# Patient Record
Sex: Female | Born: 1970 | Race: White | Hispanic: No | Marital: Married | Smoking: Never smoker
Health system: Southern US, Community
[De-identification: ages and names within clinical notes are randomized; demographics above are authoritative.]

## PROBLEM LIST (undated history)

## (undated) DIAGNOSIS — M25552 Pain in left hip: Secondary | ICD-10-CM

## (undated) DIAGNOSIS — R87629 Unspecified abnormal cytological findings in specimens from vagina: Secondary | ICD-10-CM

## (undated) DIAGNOSIS — Z309 Encounter for contraceptive management, unspecified: Secondary | ICD-10-CM

## (undated) DIAGNOSIS — M25551 Pain in right hip: Secondary | ICD-10-CM

## (undated) DIAGNOSIS — G43829 Menstrual migraine, not intractable, without status migrainosus: Secondary | ICD-10-CM

## (undated) HISTORY — DX: Menstrual migraine, not intractable, without status migrainosus: G43.829

## (undated) HISTORY — DX: Pain in left hip: M25.552

## (undated) HISTORY — DX: Unspecified abnormal cytological findings in specimens from vagina: R87.629

## (undated) HISTORY — PX: CERVICAL CONE BIOPSY: SUR198

## (undated) HISTORY — DX: Encounter for contraceptive management, unspecified: Z30.9

## (undated) HISTORY — DX: Pain in right hip: M25.551

---

## 2000-08-22 ENCOUNTER — Other Ambulatory Visit: Admission: RE | Admit: 2000-08-22 | Discharge: 2000-08-22 | Payer: Self-pay | Admitting: Obstetrics and Gynecology

## 2002-04-12 ENCOUNTER — Inpatient Hospital Stay (HOSPITAL_COMMUNITY): Admission: AD | Admit: 2002-04-12 | Discharge: 2002-04-14 | Payer: Self-pay | Admitting: Obstetrics and Gynecology

## 2007-02-27 ENCOUNTER — Other Ambulatory Visit: Admission: RE | Admit: 2007-02-27 | Discharge: 2007-02-27 | Payer: Self-pay | Admitting: Obstetrics and Gynecology

## 2008-02-28 ENCOUNTER — Other Ambulatory Visit: Admission: RE | Admit: 2008-02-28 | Discharge: 2008-02-28 | Payer: Self-pay | Admitting: Obstetrics and Gynecology

## 2009-06-20 ENCOUNTER — Other Ambulatory Visit: Admission: RE | Admit: 2009-06-20 | Discharge: 2009-06-20 | Payer: Self-pay | Admitting: Obstetrics and Gynecology

## 2010-05-28 ENCOUNTER — Other Ambulatory Visit: Payer: Self-pay | Admitting: Obstetrics & Gynecology

## 2010-05-28 DIAGNOSIS — R928 Other abnormal and inconclusive findings on diagnostic imaging of breast: Secondary | ICD-10-CM

## 2010-06-10 ENCOUNTER — Ambulatory Visit (HOSPITAL_COMMUNITY)
Admission: RE | Admit: 2010-06-10 | Discharge: 2010-06-10 | Disposition: A | Discharge status: Home / Self Care | Payer: BC Managed Care – PPO | Source: Ambulatory Visit | Attending: Obstetrics & Gynecology | Admitting: Obstetrics & Gynecology

## 2010-06-10 DIAGNOSIS — R928 Other abnormal and inconclusive findings on diagnostic imaging of breast: Secondary | ICD-10-CM | POA: Insufficient documentation

## 2010-06-12 NOTE — H&P (Signed)
   [name], [name]                         ACCOUNT NO.:  [id number]   MEDICAL RECORD NO.:  [id number]                   PATIENT TYPE:  INP   LOCATION:  LDR2                                 FACILITY:  [facility]   PHYSICIAN:  Tilda Burrow, M.D.              DATE OF BIRTH:  Jul 26, 1970   DATE OF ADMISSION:  04/12/2002  DATE OF DISCHARGE:                                HISTORY & PHYSICAL   CHIEF COMPLAINT:  Spontaneous rupture of membranes with minimal labor.   HISTORY OF PRESENT ILLNESS:  The patient is a 40 year old gravida 2, para 1  with an EDC of April 20, 2002 based on last menstrual period and correlating  first and second trimester ultrasounds.  She began prenatal care early in  the first trimester and has had regular visits.   PRENATAL LABORATORIES:  Blood type O+.  Rubella is immune.  HBSAG, HIV,  gonorrhea, Chlamydia, and GBS are all negative.  One-hour GTT was normal at  110.  Blood pressures have been 110s-120s/60s-80s.  Total weight gain is 34  pounds with a normal fundal height growth.   PAST MEDICAL HISTORY:  Noncontributory.  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   PAST SURGICAL HISTORY:  Laser cone on the cervix in the early 90s.  No  history of blood transfusions.   PAST OBSTETRICAL HISTORY:  Had a vacuum assisted delivery of a 7 pound 6  ounce female in 2001 at [redacted] weeks gestation after a 15-hour labor.   FAMILY HISTORY:  History of diabetes, brain, stomach, liver, and breast  cancer.   SOCIAL HISTORY:  Denies smoking, drinking, or using illegal drugs.  Is  married and has support from the father of the baby.   PHYSICAL EXAMINATION:  HEENT:  Within normal limits.  HEART:  Regular rate and rhythm.  LUNGS:  Clear to auscultation bilaterally.  ABDOMEN:  Soft and nontender.  Having mild irregular contractions.  Leaking  clear amniotic fluid.  PELVIC:  Cervix is 4, 80, -1 per R.N. examination.  Fetal heart rate is  reactive with an  occasional mild variable deceleration.  EXTREMITIES:  Legs are negative.   IMPRESSION:  1. Intrauterine pregnancy at 38 weeks 6 days.  2. Spontaneous rupture of membranes.  3. Apparently beginning labor.  Cervix on March 16 was 2-3 cm, thick, and     anterior.    PLAN:  The patient desires epidural at some point in her labor.  Will allow  labor to progress spontaneously at this point.     Jacklyn Shell, C.N.M.          Tilda Burrow, M.D.    FC/MEDQ  D:  04/12/2002  T:  04/12/2002  Job:  [id number]   cc:   [facility]

## 2010-06-12 NOTE — Op Note (Signed)
   NAME:  [name], [name]                         ACCOUNT NO.:  [id number]   MEDICAL RECORD NO.:  [id number]                   PATIENT TYPE:  INP   LOCATION:  LDR2                                 FACILITY:  [facility]   PHYSICIAN:  Tilda Burrow, M.D.              DATE OF BIRTH:  1970/08/03   DATE OF PROCEDURE:  04/12/2002  DATE OF DISCHARGE:                                 OPERATIVE REPORT   DELIVERY NOTE:  The patient was noted to be fully dilated with complaints of  rectal pressure at approximately 1645.  She was allowed to push and began  pushing at approximately 1700.  The fetal heart rate began having variable  decelerations with each push down to the 50s with an occasional slow return  to baseline.  However, good variability remained in the baseline and between  contractions.  O2 was utilized and the baby was making progress.  At 1748  the patient had a spontaneous vaginal delivery of a viable female infant.  Apgars are 8 and 9, weight 8 pounds 10 ounces.  Twenty units of Pitocin  dilated in 1000 mL of lactated Ringer's was then infused rapidly IV.  The  placenta separated spontaneously and was delivered via maternal pushing  effort and controlled cord traction at 1753.  It was inspected and appears  to be intact with a three-vessel cord.  The fundus was immediately firm.  The vagina was then inspected and a second degree laceration was noted.  It  was infiltrated with 10 mL of 1% Xylocaine and repaired with a 2-0 Vicryl  suture.  The patient tolerated the procedure well; however, she complained  of intense contraction pain after the delivery of the placenta, so she was  given 2 mg of morphine IV, to which she responded well.     Jacklyn Shell, C.N.M.          Tilda Burrow, M.D.    FC/MEDQ  D:  04/12/2002  T:  04/13/2002  Job:  [id number]

## 2010-06-12 NOTE — Discharge Summary (Signed)
   [name], [name]                         ACCOUNT NO.:  [id number]   MEDICAL RECORD NO.:  [id number]                   PATIENT TYPE:  INP   LOCATION:  A426                                 FACILITY:  [facility]   PHYSICIAN:  Tilda Burrow, M.D.              DATE OF BIRTH:  [birth year]   DATE OF ADMISSION:  04/12/2002  DATE OF DISCHARGE:  04/14/2002                                 DISCHARGE SUMMARY   ADMISSION DIAGNOSES:  1. Pregnancy [redacted] week gestation.  2. Spontaneous rupture of membranes without labor.   DISCHARGE DIAGNOSIS:  Pregnancy 39 weeks, delivered.   PROCEDURE:  1. On 04/12/2002 Pitocin augmentation of labor, indicated.  2. Lumbar epidural catheter placement by Lazaro Arms, M.D.  3. Spontaneous vertex vaginal delivery, median episiotomy and repair by     Jacklyn Shell, C.N.M.   DISCHARGE MEDICATIONS:  1. Multivitamins with iron p.o. daily.  2. Barrier contraception until 6 months postpartum then permanent     sterilization planned.   HISTORY OF PRESENT ILLNESS:  This 40 year old female, gravida 2, para 1 with  EDC 04/20/2002 was admitted after pregnancy care followed throughout office  with blood type O positive, rubella immunity present.  Hepatitis, HIV, GC,  Chlamydia and GBS all negative.  One hour glucose tolerance test normal at  110 mg% with normal blood pressures and 34 pound weight gain.   PAST MEDICAL HISTORY:  Negative with a history of mild postpartum  depression.   PAST SURGICAL HISTORY:  Laser conization in 1990.  No transfusion.  OB  history 15 hour labor with the first baby.   PHYSICAL EXAMINATION:  Physical exam showed a term sized fetus, vertex  presentation with cervix 4 cm, 80% minus 1 upon admission.   HOSPITAL COURSE:  The patient was admitted with Pitocin augmentation of  labor initiated.  She progressed in an 8-hour labor to vaginal delivery over  a second-degree median episiotomy repaired under local anesthesia with 2-0  Vicryl.   The infant was a healthy female infant, Apgars 8 and 9 weight 8 pounds 10  ounces.  Circumcision was performed the day of discharge.  Follow up will be  in 4 weeks with contraception plans uncertain, but likely barrier  contraception for 6 months and then consideration of permanent of  sterilization.                                               Tilda Burrow, M.D.    JVF/MEDQ  D:  04/14/2002  T:  04/15/2002  Job:  [id number]   cc:   [facility]  [address]  [address] [place]  [place]  [address]

## 2010-06-12 NOTE — Op Note (Signed)
   NAME:  ANAJA, [name]                         ACCOUNT NO.:  [id number]   MEDICAL RECORD NO.:  [id number]                   PATIENT TYPE:  INP   LOCATION:  LDR2                                 FACILITY:  [facility]   PHYSICIAN:  Lazaro Arms, M.D.                DATE OF BIRTH:  1970-09-13   DATE OF PROCEDURE:  04/12/2002  DATE OF DISCHARGE:                                 OPERATIVE REPORT   EPIDURAL NOTE:   INDICATIONS FOR PROCEDURE:  [name] is a 40 year old gravida 2, para 1, in  labor with ruptured membranes, 4 cm requesting an epidural.  She is having  pretty regular labor mild-to-moderate.   DESCRIPTION OF PROCEDURE:  The patient is placed in sitting position.  She  received 1000 mL fluid bolus. There is reactive fetal heart tracing.  The  iliac crests were identified and the spinous processes are identified at the  L2-L3 interspaces  This is marked, prepped, and field draped.  Lidocaine 1%  is injected as a local anesthetic.  A 17-gauge Touhy needle is placed in the  L2-L3 interspace and a loss of resistance technique employed.  With 1 pass  the epidural space is found using this technique.  Then 10 mL of 0.125%  bupivacaine was given as a test dose.  The epidural catheter is then  threaded, without difficulty, to a depth of 5 cm into the epidural space or  11 cm at the skin.  An additional 10 mL was given.  There was no adverse  reaction.  The epidural catheter is sterilely taped in place and the  continuous pump is hooked up.  She is given a 7 mL bolus of 0.125%  bupivacaine with 2 mcg/mL of Fentanyl; and then 12 mL an hour.  The patient  is comfortable and there seems to be reactive fetal heart rate tracing and  the blood pressure is currently stable.                                               Lazaro Arms, M.D.    Loraine Maple  D:  04/12/2002  T:  04/12/2002  Job:  [id number]

## 2010-09-10 ENCOUNTER — Other Ambulatory Visit (HOSPITAL_COMMUNITY)
Admission: RE | Admit: 2010-09-10 | Discharge: 2010-09-10 | Disposition: A | Discharge status: Home / Self Care | Payer: BC Managed Care – PPO | Source: Ambulatory Visit | Attending: Obstetrics and Gynecology | Admitting: Obstetrics and Gynecology

## 2010-09-10 DIAGNOSIS — Z01419 Encounter for gynecological examination (general) (routine) without abnormal findings: Secondary | ICD-10-CM | POA: Insufficient documentation

## 2011-06-28 ENCOUNTER — Other Ambulatory Visit: Payer: Self-pay | Admitting: Adult Health

## 2011-06-28 DIAGNOSIS — Z139 Encounter for screening, unspecified: Secondary | ICD-10-CM

## 2011-07-06 ENCOUNTER — Ambulatory Visit (HOSPITAL_COMMUNITY)
Admission: RE | Admit: 2011-07-06 | Discharge: 2011-07-06 | Disposition: A | Discharge status: Home / Self Care | Payer: BC Managed Care – PPO | Source: Ambulatory Visit | Attending: Adult Health | Admitting: Adult Health

## 2011-07-06 DIAGNOSIS — Z1231 Encounter for screening mammogram for malignant neoplasm of breast: Secondary | ICD-10-CM | POA: Insufficient documentation

## 2011-07-06 DIAGNOSIS — Z139 Encounter for screening, unspecified: Secondary | ICD-10-CM

## 2011-11-01 ENCOUNTER — Other Ambulatory Visit (HOSPITAL_COMMUNITY)
Admission: RE | Admit: 2011-11-01 | Discharge: 2011-11-01 | Disposition: A | Discharge status: Home / Self Care | Payer: BC Managed Care – PPO | Source: Ambulatory Visit | Attending: Obstetrics and Gynecology | Admitting: Obstetrics and Gynecology

## 2011-11-01 DIAGNOSIS — Z1151 Encounter for screening for human papillomavirus (HPV): Secondary | ICD-10-CM | POA: Insufficient documentation

## 2011-11-01 DIAGNOSIS — Z01419 Encounter for gynecological examination (general) (routine) without abnormal findings: Secondary | ICD-10-CM | POA: Insufficient documentation

## 2012-09-29 ENCOUNTER — Other Ambulatory Visit: Payer: Self-pay | Admitting: Adult Health

## 2012-10-03 ENCOUNTER — Other Ambulatory Visit: Payer: Self-pay | Admitting: *Deleted

## 2012-10-03 MED ORDER — NORETHIN ACE-ETH ESTRAD-FE 1-20 MG-MCG PO TABS: 1.0000 | ORAL_TABLET | Freq: Every day | ORAL | Status: DC

## 2013-08-15 ENCOUNTER — Other Ambulatory Visit: Payer: Self-pay | Admitting: Adult Health

## 2014-04-04 ENCOUNTER — Other Ambulatory Visit: Payer: Self-pay | Admitting: Adult Health

## 2014-05-03 ENCOUNTER — Other Ambulatory Visit: Payer: Self-pay | Admitting: Adult Health

## 2014-09-02 ENCOUNTER — Other Ambulatory Visit: Payer: Self-pay | Admitting: Adult Health

## 2015-02-04 ENCOUNTER — Other Ambulatory Visit: Payer: Self-pay | Admitting: Adult Health

## 2015-02-23 ENCOUNTER — Other Ambulatory Visit: Payer: Self-pay | Admitting: Adult Health

## 2015-04-10 ENCOUNTER — Other Ambulatory Visit: Payer: Self-pay | Admitting: Adult Health

## 2015-04-10 DIAGNOSIS — Z1231 Encounter for screening mammogram for malignant neoplasm of breast: Secondary | ICD-10-CM

## 2015-04-12 ENCOUNTER — Other Ambulatory Visit: Payer: Self-pay | Admitting: Adult Health

## 2015-04-24 ENCOUNTER — Ambulatory Visit (HOSPITAL_COMMUNITY): Payer: Self-pay

## 2015-05-07 ENCOUNTER — Other Ambulatory Visit (HOSPITAL_COMMUNITY)
Admission: RE | Admit: 2015-05-07 | Discharge: 2015-05-07 | Disposition: A | Discharge status: Home / Self Care | Payer: 59 | Source: Ambulatory Visit | Attending: Adult Health | Admitting: Adult Health

## 2015-05-07 ENCOUNTER — Encounter: Payer: Self-pay | Admitting: Adult Health

## 2015-05-07 ENCOUNTER — Ambulatory Visit (INDEPENDENT_AMBULATORY_CARE_PROVIDER_SITE_OTHER): Payer: 59 | Admitting: Adult Health

## 2015-05-07 ENCOUNTER — Ambulatory Visit (HOSPITAL_COMMUNITY)
Admission: RE | Admit: 2015-05-07 | Discharge: 2015-05-07 | Disposition: A | Discharge status: Home / Self Care | Payer: 59 | Source: Ambulatory Visit | Attending: Adult Health | Admitting: Adult Health

## 2015-05-07 VITALS — BP 110/70 | HR 64 | Ht 60.0 in | Wt 135.5 lb

## 2015-05-07 DIAGNOSIS — Z01419 Encounter for gynecological examination (general) (routine) without abnormal findings: Secondary | ICD-10-CM | POA: Insufficient documentation

## 2015-05-07 DIAGNOSIS — G43829 Menstrual migraine, not intractable, without status migrainosus: Secondary | ICD-10-CM | POA: Insufficient documentation

## 2015-05-07 DIAGNOSIS — Z1212 Encounter for screening for malignant neoplasm of rectum: Secondary | ICD-10-CM

## 2015-05-07 DIAGNOSIS — M25552 Pain in left hip: Secondary | ICD-10-CM

## 2015-05-07 DIAGNOSIS — Z3041 Encounter for surveillance of contraceptive pills: Secondary | ICD-10-CM

## 2015-05-07 DIAGNOSIS — Z1151 Encounter for screening for human papillomavirus (HPV): Secondary | ICD-10-CM | POA: Insufficient documentation

## 2015-05-07 DIAGNOSIS — Z1231 Encounter for screening mammogram for malignant neoplasm of breast: Secondary | ICD-10-CM | POA: Diagnosis not present

## 2015-05-07 DIAGNOSIS — M25551 Pain in right hip: Secondary | ICD-10-CM

## 2015-05-07 DIAGNOSIS — Z309 Encounter for contraceptive management, unspecified: Secondary | ICD-10-CM

## 2015-05-07 HISTORY — DX: Encounter for contraceptive management, unspecified: Z30.9

## 2015-05-07 HISTORY — DX: Pain in right hip: M25.551

## 2015-05-07 HISTORY — DX: Menstrual migraine, not intractable, without status migrainosus: G43.829

## 2015-05-07 LAB — HEMOCCULT GUIAC POC 1CARD (OFFICE): Fecal Occult Blood, POC: NEGATIVE

## 2015-05-07 MED ORDER — NORETHIN ACE-ETH ESTRAD-FE 1-20 MG-MCG PO TABS: 1.0000 | ORAL_TABLET | Freq: Every day | ORAL | Status: DC

## 2015-05-07 NOTE — Progress Notes (Signed)
Patient ID: Marcella Dubsngela P Carel, female   DOB: Jul 30, 1970, 45 y.o.   MRN: [id number] History of Present Illness: Marylene Landngela is a 45 year old white female, married in for a well woman gyn exam and pap.She is happy with her OCs and does not have a period.She has menstrual headaches at times and they last 2-3 days.She also is having bilateral hip pain but L>R and it is on and off and has been since Dillion was born 13 years ago, and knees hurt at times, she sits at work.She says she had mammogram this am and has teeth cleaned and got invisalign braces.  PCP is [facility]  Current Medications, Allergies, Past Medical History, Past Surgical History, Family History and Social History were reviewed in [facility] electronic medical record.     Review of Systems: Patient denies any daily headaches, hearing loss, fatigue, blurred vision, shortness of breath, chest pain, abdominal pain, problems with bowel movements, urination, or intercourse. No  mood swings. See HPI for positives.   Physical Exam:BP 110/70 mmHg  Pulse 64  Ht 5' (1.524 m)  Wt 135 lb 8 oz (61.462 kg)  BMI 26.46 kg/m2 General:  Well developed, well nourished, no acute distress Skin:  Warm and dry Neck:  Midline trachea, normal thyroid, good ROM, no lymphadenopathy Lungs; Clear to auscultation bilaterally Breast:  No dominant palpable mass, retraction, or nipple discharge Cardiovascular: Regular rate and rhythm Abdomen:  Soft, non tender, no hepatosplenomegaly Pelvic:  External genitalia is normal in appearance, no lesions.  The vagina is normal in appearance. Urethra has no lesions or masses. The cervix is bulbous.Pap with HPV performed.  Uterus is felt to be normal size, shape, and contour.  No adnexal masses or tenderness noted.Bladder is non tender, no masses felt. Rectal: Good sphincter tone, no polyps, or hemorrhoids felt.  Hemoccult negative. Extremities/musculoskeletal:  No swelling or varicosities noted, no clubbing or cyanosis, no point  tenderness at hips.She says they are fine today. Psych:  No mood changes, alert and cooperative,seems happy   Impression: Well woman gyn exam and pap Bilateral hip pain Contraceptive management Menstrual headaches     Plan: Check CBC,CMP,TSH and lipids,A1c and vitamin D Refilled junel 1-20 x 1 year Mammogram yearly Physical in 1 year, pap in 3 if normal Colonoscopy at 49 Call orthopedic for hips

## 2015-05-07 NOTE — Patient Instructions (Addendum)
Physical in 1 year Mammogram yearly Pap in 3 years Will talk when labs back  [place]o [place] or Armen PickupMurphy Wanier in [place] for hip

## 2015-05-08 ENCOUNTER — Telehealth: Payer: Self-pay | Admitting: Adult Health

## 2015-05-08 LAB — VITAMIN D 25 HYDROXY (VIT D DEFICIENCY, FRACTURES): Vit D, 25-Hydroxy: 29.9 ng/mL — ABNORMAL LOW (ref 30.0–100.0)

## 2015-05-08 LAB — LIPID PANEL
CHOL/HDL RATIO: 3.9 ratio (ref 0.0–4.4)
Cholesterol, Total: 201 mg/dL — ABNORMAL HIGH (ref 100–199)
HDL: 52 mg/dL (ref >39)
LDL Calculated: 125 mg/dL — ABNORMAL HIGH (ref 0–99)
Triglycerides: 119 mg/dL (ref 0–149)
VLDL CHOLESTEROL CAL: 24 mg/dL (ref 5–40)

## 2015-05-08 LAB — HEMOGLOBIN A1C
Est. average glucose Bld gHb Est-mCnc: 108 mg/dL
Hgb A1c MFr Bld: 5.4 % (ref 4.8–5.6)

## 2015-05-08 LAB — COMPREHENSIVE METABOLIC PANEL
ALBUMIN: 4.6 g/dL (ref 3.5–5.5)
ALT: 15 IU/L (ref 0–32)
AST: 18 IU/L (ref 0–40)
Albumin/Globulin Ratio: 1.8 (ref 1.2–2.2)
Alkaline Phosphatase: 45 IU/L (ref 39–117)
BUN / CREAT RATIO: 15 (ref 9–23)
BUN: 11 mg/dL (ref 6–24)
Bilirubin Total: 0.5 mg/dL (ref 0.0–1.2)
CALCIUM: 9.5 mg/dL (ref 8.7–10.2)
CO2: 24 mmol/L (ref 18–29)
CREATININE: 0.75 mg/dL (ref 0.57–1.00)
Chloride: 98 mmol/L (ref 96–106)
GFR calc non Af Amer: 96 mL/min/{1.73_m2} (ref >59)
GFR, EST AFRICAN AMERICAN: 111 mL/min/{1.73_m2} (ref >59)
GLUCOSE: 74 mg/dL (ref 65–99)
Globulin, Total: 2.6 g/dL (ref 1.5–4.5)
Potassium: 4.5 mmol/L (ref 3.5–5.2)
Sodium: 139 mmol/L (ref 134–144)
TOTAL PROTEIN: 7.2 g/dL (ref 6.0–8.5)

## 2015-05-08 LAB — CBC
HEMOGLOBIN: 14.5 g/dL (ref 11.1–15.9)
Hematocrit: 42.7 % (ref 34.0–46.6)
MCH: 30.2 pg (ref 26.6–33.0)
MCHC: 34 g/dL (ref 31.5–35.7)
MCV: 89 fL (ref 79–97)
Platelets: 314 10*3/uL (ref 150–379)
RBC: 4.8 x10E6/uL (ref 3.77–5.28)
RDW: 13.3 % (ref 12.3–15.4)
WBC: 6.1 10*3/uL (ref 3.4–10.8)

## 2015-05-08 LAB — TSH: TSH: 2.65 u[IU]/mL (ref 0.450–4.500)

## 2015-05-08 NOTE — Telephone Encounter (Signed)
Pt aware of labs to take vitamin D 3 1000-2000 IU per day

## 2015-05-09 LAB — CYTOLOGY - PAP

## 2016-01-31 ENCOUNTER — Other Ambulatory Visit: Payer: Self-pay | Admitting: Adult Health

## 2016-03-01 ENCOUNTER — Other Ambulatory Visit: Payer: Self-pay | Admitting: *Deleted

## 2016-03-01 MED ORDER — NORETHIN ACE-ETH ESTRAD-FE 1-20 MG-MCG PO TABS: 1.0000 | ORAL_TABLET | Freq: Every day | ORAL | 3 refills | Status: DC

## 2016-12-14 ENCOUNTER — Other Ambulatory Visit: Payer: Self-pay | Admitting: Adult Health

## 2017-02-25 ENCOUNTER — Other Ambulatory Visit: Payer: Self-pay | Admitting: Adult Health

## 2017-02-25 DIAGNOSIS — Z1231 Encounter for screening mammogram for malignant neoplasm of breast: Secondary | ICD-10-CM

## 2017-03-29 ENCOUNTER — Other Ambulatory Visit: Payer: 59 | Admitting: Adult Health

## 2017-03-31 ENCOUNTER — Ambulatory Visit (INDEPENDENT_AMBULATORY_CARE_PROVIDER_SITE_OTHER): Payer: 59 | Admitting: Adult Health

## 2017-03-31 ENCOUNTER — Encounter: Payer: Self-pay | Admitting: Advanced Practice Midwife

## 2017-03-31 ENCOUNTER — Ambulatory Visit (HOSPITAL_COMMUNITY)
Admission: RE | Admit: 2017-03-31 | Discharge: 2017-03-31 | Disposition: A | Discharge status: Home / Self Care | Payer: 59 | Source: Ambulatory Visit | Attending: Adult Health | Admitting: Adult Health

## 2017-03-31 ENCOUNTER — Encounter: Payer: Self-pay | Admitting: Adult Health

## 2017-03-31 VITALS — BP 130/90 | HR 81 | Ht 59.5 in | Wt 146.5 lb

## 2017-03-31 DIAGNOSIS — Z3041 Encounter for surveillance of contraceptive pills: Secondary | ICD-10-CM

## 2017-03-31 DIAGNOSIS — R030 Elevated blood-pressure reading, without diagnosis of hypertension: Secondary | ICD-10-CM | POA: Diagnosis not present

## 2017-03-31 DIAGNOSIS — Z1231 Encounter for screening mammogram for malignant neoplasm of breast: Secondary | ICD-10-CM | POA: Insufficient documentation

## 2017-03-31 DIAGNOSIS — Z1211 Encounter for screening for malignant neoplasm of colon: Secondary | ICD-10-CM

## 2017-03-31 DIAGNOSIS — Z1212 Encounter for screening for malignant neoplasm of rectum: Secondary | ICD-10-CM | POA: Diagnosis not present

## 2017-03-31 DIAGNOSIS — Z01411 Encounter for gynecological examination (general) (routine) with abnormal findings: Secondary | ICD-10-CM

## 2017-03-31 DIAGNOSIS — Z01419 Encounter for gynecological examination (general) (routine) without abnormal findings: Secondary | ICD-10-CM | POA: Insufficient documentation

## 2017-03-31 LAB — HEMOCCULT GUIAC POC 1CARD (OFFICE): FECAL OCCULT BLD: NEGATIVE

## 2017-03-31 MED ORDER — NORETHIN ACE-ETH ESTRAD-FE 1-20 MG-MCG PO TABS: 1.0000 | ORAL_TABLET | Freq: Every day | ORAL | 3 refills | Status: DC

## 2017-03-31 NOTE — Progress Notes (Signed)
Patient ID: Marcella Dubsngela P Dresser, female   DOB: 1970-02-05, 47 y.o.   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  She had mammogram this morning.   Current Medications, Allergies, Past Medical History, Past Surgical History, Family History and Social History were reviewed in [facility] Link electronic medical record.     Review of Systems: Patient denies any headaches, hearing loss, fatigue, blurred vision, shortness of breath, chest pain, abdominal pain, problems with bowel movements, urination, or intercourse(not comfortable at times). No joint pain or mood swings.    Physical Exam:BP 130/90 (BP Location: Left Arm, Cuff Size: Normal)   Pulse 81   Ht 4' 11.5" (1.511 m)   Wt 146 lb 8 oz (66.5 kg)   BMI 29.09 kg/m  General:  Well developed, well nourished, no acute distress Skin:  Warm and dry Neck:  Midline trachea, normal thyroid, good ROM, no lymphadenopathy Lungs; Clear to auscultation bilaterally Breast:  No dominant palpable mass, retraction, or nipple discharge Cardiovascular: Regular rate and rhythm Abdomen:  Soft, non tender, no hepatosplenomegaly Pelvic:  External genitalia is normal in appearance, no lesions.  The vagina is normal in appearance. Urethra has no lesions or masses. The cervix is bulbous.  Uterus is felt to be normal size, shape, and contour.  No adnexal masses or tenderness noted.Bladder is non tender, no masses felt. Rectal: Good sphincter tone, no polyps, or hemorrhoids felt.  Hemoccult negative. Extremities/musculoskeletal:  No swelling or varicosities noted, no clubbing or cyanosis Psych:  No mood changes, alert and cooperative,seems happy PHQ 2 score 0.  Impression: 1. Well woman exam with routine gynecological exam   2. Encounter for  surveillance of contraceptive pills   3. Screening for colorectal cancer   4. Elevated BP without diagnosis of hypertension       Plan: Use good lubricate, and increase foreplay  Meds ordered this encounter  Medications  . norethindrone-ethinyl estradiol (JUNEL FE 1/20) 1-20 MG-MCG tablet    Sig: Take 1 tablet by mouth daily.    Dispense:  84 tablet    Refill:  3    Order Specific Question:   Supervising Provider    Answer:   Despina HiddenEURE, LUTHER H [[id number]]  Recheck BP at work  Pap and physical in 1 year Mammogram yearly Fasting labs next year

## 2018-04-14 ENCOUNTER — Other Ambulatory Visit: Payer: Self-pay | Admitting: Adult Health

## 2018-06-13 ENCOUNTER — Other Ambulatory Visit (HOSPITAL_COMMUNITY): Payer: Self-pay | Admitting: Adult Health

## 2018-06-13 DIAGNOSIS — Z1231 Encounter for screening mammogram for malignant neoplasm of breast: Secondary | ICD-10-CM

## 2018-08-23 ENCOUNTER — Telehealth: Payer: Self-pay | Admitting: Adult Health

## 2018-08-23 MED ORDER — SULFAMETHOXAZOLE-TRIMETHOPRIM 800-160 MG PO TABS: 1.0000 | ORAL_TABLET | Freq: Two times a day (BID) | ORAL | 0 refills | Status: DC

## 2018-08-23 NOTE — Addendum Note (Signed)
Addended by: Derrek Monaco A on: 08/23/2018 01:59 PM   Modules accepted: Orders

## 2018-08-23 NOTE — Telephone Encounter (Signed)
Pt is wanting to see if medication for a UTI can be sent in for her.

## 2018-08-23 NOTE — Telephone Encounter (Signed)
Will rx septra ds  

## 2018-08-23 NOTE — Telephone Encounter (Signed)
Pt having burning with urination and pressure in pelvic region. + discomfort. Can you send in med? Thanks!! [place]

## 2018-10-12 ENCOUNTER — Other Ambulatory Visit: Payer: Self-pay | Admitting: Women's Health

## 2018-10-12 MED ORDER — LO LOESTRIN FE 1 MG-10 MCG / 10 MCG PO TABS: 1.0000 | ORAL_TABLET | Freq: Every day | ORAL | 3 refills | Status: DC

## 2018-11-07 DIAGNOSIS — Z23 Encounter for immunization: Secondary | ICD-10-CM | POA: Diagnosis not present

## 2019-03-21 DIAGNOSIS — Z23 Encounter for immunization: Secondary | ICD-10-CM | POA: Diagnosis not present

## 2019-10-04 DIAGNOSIS — H10413 Chronic giant papillary conjunctivitis, bilateral: Secondary | ICD-10-CM | POA: Diagnosis not present

## 2019-10-26 ENCOUNTER — Other Ambulatory Visit: Payer: Self-pay | Admitting: Women's Health

## 2019-10-31 ENCOUNTER — Telehealth: Payer: Self-pay | Admitting: Adult Health

## 2019-10-31 ENCOUNTER — Other Ambulatory Visit (HOSPITAL_COMMUNITY): Payer: Self-pay | Admitting: Adult Health

## 2019-10-31 DIAGNOSIS — Z1231 Encounter for screening mammogram for malignant neoplasm of breast: Secondary | ICD-10-CM

## 2019-10-31 DIAGNOSIS — R7989 Other specified abnormal findings of blood chemistry: Secondary | ICD-10-CM

## 2019-10-31 DIAGNOSIS — Z01419 Encounter for gynecological examination (general) (routine) without abnormal findings: Secondary | ICD-10-CM

## 2019-10-31 DIAGNOSIS — Z131 Encounter for screening for diabetes mellitus: Secondary | ICD-10-CM

## 2019-10-31 MED ORDER — NORETHIN ACE-ETH ESTRAD-FE 1-20 MG-MCG PO TABS: ORAL_TABLET | ORAL | 3 refills | Status: DC

## 2019-10-31 NOTE — Telephone Encounter (Signed)
Pt aware that pills sent in and that lab orders are in

## 2019-10-31 NOTE — Telephone Encounter (Signed)
Check CBC,CMP,TSH and lipids,A1c and vitamin D,orders in.

## 2019-10-31 NOTE — Addendum Note (Signed)
Addended by: Cyril Mourning A on: 10/31/2019 12:31 PM   Modules accepted: Orders

## 2019-10-31 NOTE — Telephone Encounter (Signed)
Patient called stating that she was given a refill of her [facility] by another provider here but was not prescribed like Victorino Dike states to take them, Pt states that she would like a call back from [place] because she has run out of her [facility] and she can not get a refill until 11/10/2019. Please contact pt

## 2019-10-31 NOTE — Telephone Encounter (Signed)
Telephoned patient at home number and unable to leave message due to mailbox not set up.  °

## 2019-10-31 NOTE — Addendum Note (Signed)
Addended by: Cyril Mourning A on: 10/31/2019 12:27 PM   Modules accepted: Orders

## 2019-10-31 NOTE — Telephone Encounter (Signed)
[name] can you put in her fasting physical labs she has appt with you on 11-16 ang going to [facility] in [place] on NOV 8 th to get fasting labs

## 2019-10-31 NOTE — Telephone Encounter (Signed)
Telephoned patient at home number. Patient states prescription for birth control was written incorrectly. Patient states she takes them continuously. If not taken like this will experience severe migraines. Would like another prescription sent in stating this. Patient did schedule physical. Advised would send to provider to see what could be done. Patient voiced understanding.

## 2019-11-07 ENCOUNTER — Other Ambulatory Visit: Payer: Self-pay

## 2019-11-07 ENCOUNTER — Ambulatory Visit (HOSPITAL_COMMUNITY)
Admission: RE | Admit: 2019-11-07 | Discharge: 2019-11-07 | Disposition: A | Discharge status: Home / Self Care | Payer: BLUE CROSS/BLUE SHIELD | Source: Ambulatory Visit | Attending: Adult Health | Admitting: Adult Health

## 2019-11-07 DIAGNOSIS — Z1231 Encounter for screening mammogram for malignant neoplasm of breast: Secondary | ICD-10-CM | POA: Diagnosis not present

## 2019-11-12 ENCOUNTER — Other Ambulatory Visit (HOSPITAL_COMMUNITY): Payer: Self-pay | Admitting: Adult Health

## 2019-11-12 DIAGNOSIS — R928 Other abnormal and inconclusive findings on diagnostic imaging of breast: Secondary | ICD-10-CM

## 2019-11-15 DIAGNOSIS — Z23 Encounter for immunization: Secondary | ICD-10-CM | POA: Diagnosis not present

## 2019-11-22 DIAGNOSIS — H10413 Chronic giant papillary conjunctivitis, bilateral: Secondary | ICD-10-CM | POA: Diagnosis not present

## 2019-12-03 ENCOUNTER — Other Ambulatory Visit: Payer: 59

## 2019-12-03 DIAGNOSIS — Z01419 Encounter for gynecological examination (general) (routine) without abnormal findings: Secondary | ICD-10-CM | POA: Diagnosis not present

## 2019-12-03 DIAGNOSIS — Z131 Encounter for screening for diabetes mellitus: Secondary | ICD-10-CM | POA: Diagnosis not present

## 2019-12-03 DIAGNOSIS — R7989 Other specified abnormal findings of blood chemistry: Secondary | ICD-10-CM | POA: Diagnosis not present

## 2019-12-04 LAB — COMPREHENSIVE METABOLIC PANEL
ALT: 15 IU/L (ref 0–32)
AST: 20 IU/L (ref 0–40)
Albumin/Globulin Ratio: 2 (ref 1.2–2.2)
Albumin: 4.5 g/dL (ref 3.8–4.8)
Alkaline Phosphatase: 49 IU/L (ref 44–121)
BUN/Creatinine Ratio: 12 (ref 9–23)
BUN: 12 mg/dL (ref 6–24)
Bilirubin Total: 0.3 mg/dL (ref 0.0–1.2)
CO2: 22 mmol/L (ref 20–29)
Calcium: 9.6 mg/dL (ref 8.7–10.2)
Chloride: 103 mmol/L (ref 96–106)
Creatinine, Ser: 0.98 mg/dL (ref 0.57–1.00)
GFR calc Af Amer: 78 mL/min/{1.73_m2} (ref >59)
GFR calc non Af Amer: 67 mL/min/{1.73_m2} (ref >59)
Globulin, Total: 2.3 g/dL (ref 1.5–4.5)
Glucose: 86 mg/dL (ref 65–99)
Potassium: 4 mmol/L (ref 3.5–5.2)
Sodium: 139 mmol/L (ref 134–144)
Total Protein: 6.8 g/dL (ref 6.0–8.5)

## 2019-12-04 LAB — TSH: TSH: 2.7 u[IU]/mL (ref 0.450–4.500)

## 2019-12-04 LAB — LIPID PANEL
Chol/HDL Ratio: 4.5 ratio — ABNORMAL HIGH (ref 0.0–4.4)
Cholesterol, Total: 202 mg/dL — ABNORMAL HIGH (ref 100–199)
HDL: 45 mg/dL (ref >39)
LDL Chol Calc (NIH): 138 mg/dL — ABNORMAL HIGH (ref 0–99)
Triglycerides: 105 mg/dL (ref 0–149)
VLDL Cholesterol Cal: 19 mg/dL (ref 5–40)

## 2019-12-04 LAB — HEMOGLOBIN A1C
Est. average glucose Bld gHb Est-mCnc: 103 mg/dL
Hgb A1c MFr Bld: 5.2 % (ref 4.8–5.6)

## 2019-12-04 LAB — CBC
Hematocrit: 43.7 % (ref 34.0–46.6)
Hemoglobin: 14.6 g/dL (ref 11.1–15.9)
MCH: 29.9 pg (ref 26.6–33.0)
MCHC: 33.4 g/dL (ref 31.5–35.7)
MCV: 90 fL (ref 79–97)
Platelets: 335 10*3/uL (ref 150–450)
RBC: 4.88 x10E6/uL (ref 3.77–5.28)
RDW: 12 % (ref 11.7–15.4)
WBC: 6.7 10*3/uL (ref 3.4–10.8)

## 2019-12-04 LAB — VITAMIN D 25 HYDROXY (VIT D DEFICIENCY, FRACTURES): Vit D, 25-Hydroxy: 34.2 ng/mL (ref 30.0–100.0)

## 2019-12-11 ENCOUNTER — Ambulatory Visit (INDEPENDENT_AMBULATORY_CARE_PROVIDER_SITE_OTHER): Payer: BLUE CROSS/BLUE SHIELD | Admitting: Adult Health

## 2019-12-11 ENCOUNTER — Ambulatory Visit (HOSPITAL_COMMUNITY)
Admission: RE | Admit: 2019-12-11 | Discharge: 2019-12-11 | Disposition: A | Discharge status: Home / Self Care | Payer: BLUE CROSS/BLUE SHIELD | Source: Ambulatory Visit | Attending: Adult Health | Admitting: Adult Health

## 2019-12-11 ENCOUNTER — Other Ambulatory Visit (HOSPITAL_COMMUNITY)
Admission: RE | Admit: 2019-12-11 | Discharge: 2019-12-11 | Disposition: A | Discharge status: Home / Self Care | Payer: BLUE CROSS/BLUE SHIELD | Source: Ambulatory Visit | Attending: Adult Health | Admitting: Adult Health

## 2019-12-11 ENCOUNTER — Encounter: Payer: Self-pay | Admitting: Adult Health

## 2019-12-11 ENCOUNTER — Other Ambulatory Visit: Payer: Self-pay

## 2019-12-11 ENCOUNTER — Other Ambulatory Visit: Payer: 59 | Admitting: Adult Health

## 2019-12-11 VITALS — BP 126/94 | HR 75 | Ht 59.25 in | Wt 140.5 lb

## 2019-12-11 DIAGNOSIS — Z3041 Encounter for surveillance of contraceptive pills: Secondary | ICD-10-CM | POA: Diagnosis not present

## 2019-12-11 DIAGNOSIS — Z1211 Encounter for screening for malignant neoplasm of colon: Secondary | ICD-10-CM | POA: Diagnosis not present

## 2019-12-11 DIAGNOSIS — R928 Other abnormal and inconclusive findings on diagnostic imaging of breast: Secondary | ICD-10-CM

## 2019-12-11 DIAGNOSIS — Z01419 Encounter for gynecological examination (general) (routine) without abnormal findings: Secondary | ICD-10-CM | POA: Insufficient documentation

## 2019-12-11 DIAGNOSIS — Z1151 Encounter for screening for human papillomavirus (HPV): Secondary | ICD-10-CM | POA: Insufficient documentation

## 2019-12-11 DIAGNOSIS — R922 Inconclusive mammogram: Secondary | ICD-10-CM | POA: Diagnosis not present

## 2019-12-11 LAB — HEMOCCULT GUIAC POC 1CARD (OFFICE): Fecal Occult Blood, POC: NEGATIVE

## 2019-12-11 MED ORDER — NORETHIN ACE-ETH ESTRAD-FE 1-20 MG-MCG PO TABS: ORAL_TABLET | ORAL | 3 refills | Status: DC

## 2019-12-11 NOTE — Progress Notes (Signed)
Patient ID: [name], female   DOB: Mar 21, 1970, 49 y.o.   MRN: [id number] History of Present Illness: [name] is a 49 year old white female,married, G2P2 in for a well woman gyn exam and pap. She is works at [employer], and has 1 more class for her BS. She had abnormal mammogram and has follow up today.   Current Medications, Allergies, Past Medical History, Past Surgical History, Family History and Social History were reviewed in [facility] record.     Review of Systems: Patient denies any headaches, hearing loss, fatigue, blurred vision, shortness of breath, chest pain, abdominal pain, problems with bowel movements, urination, or intercourse. No joint pain or mood swings.    Physical Exam:BP (!) 126/94 (BP Location: Left Arm, Patient Position: Sitting, Cuff Size: Normal)   Pulse 75   Ht 4' 11.25" (1.505 m)   Wt 140 lb 8 oz (63.7 kg)   BMI 28.14 kg/m  General:  Well developed, well nourished, no acute distress Skin:  Warm and dry Neck:  Midline trachea, normal thyroid, good ROM, no lymphadenopathy Lungs; Clear to auscultation bilaterally Breast:  No dominant palpable mass, retraction, or nipple discharge Cardiovascular: Regular rate and rhythm Abdomen:  Soft, non tender, no hepatosplenomegaly Pelvic:  External genitalia is normal in appearance, no lesions.  The vagina is normal in appearance. Urethra has no lesions or masses. The cervix is smooth, pap with high risk HPV genotyping performed.  Uterus is felt to be normal size, shape, and contour.  No adnexal masses or tenderness noted.Bladder is non tender, no masses felt. Rectal: Good sphincter tone, no polyps, or hemorrhoids felt.  Hemoccult negative. Extremities/musculoskeletal:  No swelling or varicosities noted, no clubbing or cyanosis Psych:  No mood changes, alert and cooperative,seems happy Reviewed labs with her, will add omega 3s and try to decrease cheese to increase HDL and lower LDL.  AA is 1 Fall  risk is low PHQ 9 score is 11, no SI Examination chaperoned by Tish,RN.   [facility] - 12/11/19 0957      Pregnancy Intention Screening   Does the patient want to become pregnant in the next year? No    Does the patient's partner want to become pregnant in the next year? No    Would the patient like to discuss contraceptive options today? No      Contraception Wrap Up   Current Method Oral Contraceptive    End Method Oral Contraceptive    Contraception Counseling Provided No           Impression and Plan: 1. Encounter for gynecological examination with Papanicolaou smear of cervix Pap sent Physical in 1 year Pap in 3 if normal Mammogram follow up today Pt declines colonoscopy and also cologuard at this time   2. Encounter for screening fecal occult blood testing   3. Encounter for surveillance of contraceptive pills Will continue OCs til 50, then stop for a month and check FSH  Meds ordered this encounter  Medications  . norethindrone-ethinyl estradiol (JUNEL FE 1/20) 1-20 MG-MCG tablet    Sig: Take 1 daily continuously,skip inert pills    Dispense:  90 tablet    Refill:  3    Order Specific Question:   Supervising Provider    Answer:   Duane Lope H [[id number]]

## 2019-12-14 LAB — CYTOLOGY - PAP
Comment: NEGATIVE
Diagnosis: NEGATIVE
High risk HPV: NEGATIVE

## 2020-01-31 DIAGNOSIS — R6889 Other general symptoms and signs: Secondary | ICD-10-CM | POA: Diagnosis not present

## 2020-11-25 DIAGNOSIS — Z23 Encounter for immunization: Secondary | ICD-10-CM | POA: Diagnosis not present

## 2020-12-24 DIAGNOSIS — J029 Acute pharyngitis, unspecified: Secondary | ICD-10-CM | POA: Diagnosis not present

## 2021-03-02 ENCOUNTER — Other Ambulatory Visit: Payer: Self-pay | Admitting: *Deleted

## 2021-03-02 MED ORDER — NORETHIN ACE-ETH ESTRAD-FE 1-20 MG-MCG PO TABS: ORAL_TABLET | ORAL | 0 refills | Status: DC

## 2021-03-03 ENCOUNTER — Telehealth: Payer: Self-pay

## 2021-03-03 NOTE — Telephone Encounter (Signed)
Called and left message that her prescription had been sent in.

## 2021-03-03 NOTE — Telephone Encounter (Signed)
PT CALLED AND STATED THAT HER PRESCRIPTION HAS EXPIRED AND THE PHARMACY SENT A REQUEST TO GET AN APPROVAL LAST WEEK AND SHE WANTED TO KNOW IF THE REQUEST WAS RECEIVED. PT HAS AN APPOINTMENT FOR MARCH 8 BUT NEEDS MEDS BEFORE THAT DATE

## 2021-04-01 ENCOUNTER — Ambulatory Visit: Payer: BLUE CROSS/BLUE SHIELD | Admitting: Adult Health

## 2021-04-08 ENCOUNTER — Encounter: Payer: Self-pay | Admitting: Adult Health

## 2021-04-08 ENCOUNTER — Ambulatory Visit (INDEPENDENT_AMBULATORY_CARE_PROVIDER_SITE_OTHER): Payer: 59 | Admitting: Adult Health

## 2021-04-08 ENCOUNTER — Other Ambulatory Visit: Payer: Self-pay

## 2021-04-08 VITALS — BP 137/93 | HR 70 | Ht 59.5 in | Wt 138.0 lb

## 2021-04-08 DIAGNOSIS — Z3041 Encounter for surveillance of contraceptive pills: Secondary | ICD-10-CM | POA: Diagnosis not present

## 2021-04-08 DIAGNOSIS — Z01419 Encounter for gynecological examination (general) (routine) without abnormal findings: Secondary | ICD-10-CM | POA: Diagnosis not present

## 2021-04-08 DIAGNOSIS — Z1211 Encounter for screening for malignant neoplasm of colon: Secondary | ICD-10-CM | POA: Diagnosis not present

## 2021-04-08 DIAGNOSIS — F419 Anxiety disorder, unspecified: Secondary | ICD-10-CM | POA: Insufficient documentation

## 2021-04-08 DIAGNOSIS — R030 Elevated blood-pressure reading, without diagnosis of hypertension: Secondary | ICD-10-CM

## 2021-04-08 LAB — HEMOCCULT GUIAC POC 1CARD (OFFICE): Fecal Occult Blood, POC: NEGATIVE

## 2021-04-08 MED ORDER — NORETHIN ACE-ETH ESTRAD-FE 1-20 MG-MCG PO TABS: ORAL_TABLET | ORAL | 3 refills | Status: DC

## 2021-04-08 MED ORDER — HYDROXYZINE HCL 10 MG PO TABS: 10.0000 mg | ORAL_TABLET | Freq: Three times a day (TID) | ORAL | 1 refills | Status: DC | PRN

## 2021-04-08 NOTE — Progress Notes (Signed)
Patient ID: [name], female   DOB: September 22, 1970, 51 y.o.   MRN: [id number] ?History of Present Illness: ?[name] is a 51 year old white female,married, G2P2 in for a well woman gyn exam. She has moved to [place] to be closer to kids, both in college. ?She was off OCs a week and spotted, but good now that back on. ?She currently has no PCP, but looking. ?Lab Results  ?Component Value Date  ? DIAGPAP  12/11/2019  ?  - Negative for intraepithelial lesion or malignancy (NILM)  ? HPVHIGH Negative 12/11/2019  ?  ? ? ?Current Medications, Allergies, Past Medical History, Past Surgical History, Family History and Social History were reviewed in [employer] record.   ? ? ?Review of Systems: ?Patient denies any headaches, hearing loss, fatigue, blurred vision, shortness of breath, chest pain, abdominal pain, problems with bowel movements, urination, or intercourse. No joint pain or mood swings.  ?+anxiety ? ? ?Physical Exam:BP (!) 137/93 (BP Location: Left Arm, Patient Position: Sitting, Cuff Size: Normal)   Pulse 70   Ht 4' 11.5" (1.511 m)   Wt 138 lb (62.6 kg)   BMI 27.41 kg/m?   ?General:  Well developed, well nourished, no acute distress ?Skin:  Warm and dry ?Neck:  Midline trachea, normal thyroid, good ROM, no lymphadenopathy ?Lungs; Clear to auscultation bilaterally ?Breast:  No dominant palpable mass, retraction, or nipple discharge ?Cardiovascular: Regular rate and rhythm ?Abdomen:  Soft, non tender, no hepatosplenomegaly ?Pelvic:  External genitalia is normal in appearance, no lesions.  The vagina is normal in appearance. Urethra has no lesions or masses. The cervix is bulbous.  Uterus is felt to be normal size, shape, and contour.  No adnexal masses or tenderness noted.Bladder is non tender, no masses felt. ?Rectal: Good sphincter tone, no polyps, or hemorrhoids felt.  Hemoccult negative. ?Extremities/musculoskeletal:  No swelling or varicosities noted, no clubbing or cyanosis ?Psych:   No mood changes, alert and cooperative,seems happy ?AA is 2 ?Fall risk is low ?Depression screen [facility] 2/9 04/08/2021 12/11/2019 03/31/2017  ?Decreased Interest 1 1 0  ?Down, Depressed, Hopeless 1 1 0  ?PHQ - 2 Score 2 2 0  ?Altered sleeping 1 1 -  ?Tired, decreased energy 1 1 -  ?Change in appetite 1 1 -  ?Feeling bad or failure about yourself  1 2 -  ?Trouble concentrating 1 2 -  ?Moving slowly or fidgety/restless 0 2 -  ?Suicidal thoughts 0 0 -  ?PHQ-9 Score 7 11 -  ?Difficult doing work/chores - Somewhat difficult -  ?  ?GAD 7 : Generalized Anxiety Score 04/08/2021 12/11/2019  ?Nervous, Anxious, on Edge 2 2  ?Control/stop worrying 2 2  ?Worry too much - different things 2 2  ?Trouble relaxing 2 2  ?Restless 2 2  ?Easily annoyed or irritable 2 2  ?Afraid - awful might happen 2 0  ?Total GAD 7 Score 14 12  ? ?  ? Upstream - 04/08/21 1148   ? ?  ? Pregnancy Intention Screening  ? Does the patient want to become pregnant in the next year? No   ? Does the patient's partner want to become pregnant in the next year? No   ? Would the patient like to discuss contraceptive options today? No   ?  ? Contraception Wrap Up  ? Current Method Oral Contraceptive   ? End Method Oral Contraceptive   ? ?  ?  ? ?  ?  ? 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 ? ?Impression and Plan: ?1. Encounter for surveillance of contraceptive pills ?Will refill junel 1/20 ?Meds ordered this encounter  ?Medications  ? hydrOXYzine (ATARAX) 10 MG tablet  ?  Sig: Take 1 tablet (10 mg total) by mouth 3 (three) times daily as needed.  ?  Dispense:  60 tablet  ?  Refill:  1  ?  Order Specific Question:   Supervising Provider  ?  Answer:   Duane Lope H [[id number]]  ? norethindrone-ethinyl estradiol-FE (JUNEL FE 1/20) 1-20 MG-MCG tablet  ?  Sig: Take 1 daily continuously,skip inert pills  ?  Dispense:  90 tablet  ?  Refill:  3  ?  Order Specific Question:   Supervising Provider  ?  Answer:   Duane Lope H [[id number]]  ?  ? ?2. Encounter for screening fecal occult  blood testing ? ?3. Well woman exam with routine gynecological exam ?Pap and physical in 1 year ? ?4. Anxiety ?Will rx vistaril 10 mg tid prn, as she does not want to take something daily  ?She feels it is related to daughter being out of the country doing missionary work, and lives in apartment currently ? ?5. Elevated BP without diagnosis of hypertension ?Keep check on BP at Home ?Review DASH diet, decrease salt and sugar and walk more ?Take omega 3 more regularly  ? ? ? ?  ?  ?

## 2021-10-19 IMAGING — MG DIGITAL SCREENING BILAT W/ TOMO W/ CAD
8 series · 9 of 24 positions shown · non-contrast
Comparison: Previous exam(s).

CLINICAL DATA: Screening.

EXAM:
DIGITAL SCREENING BILATERAL MAMMOGRAM WITH TOMO AND CAD

[R CC synth-2D]
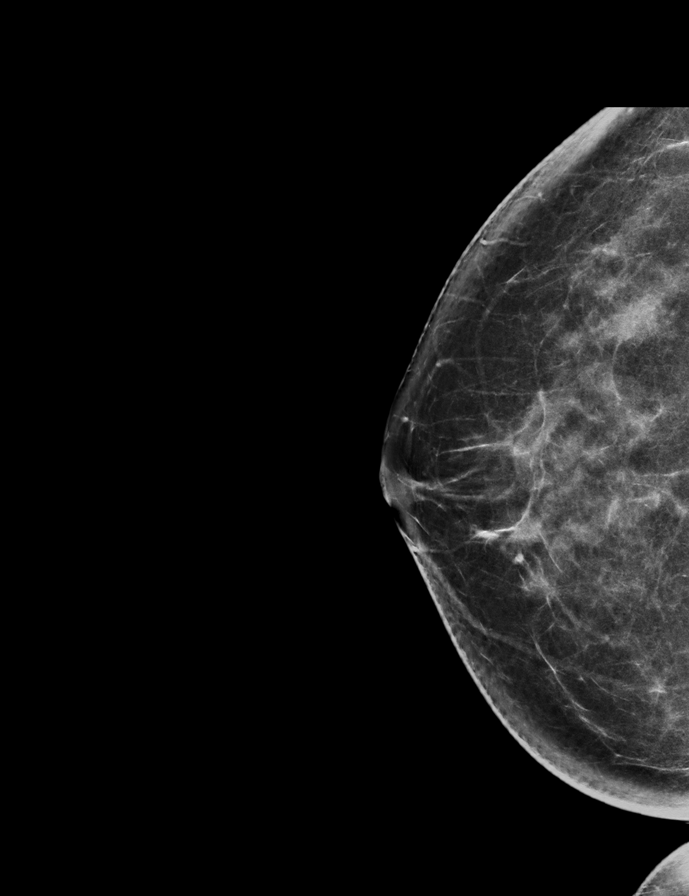

[R MLO synth-2D]
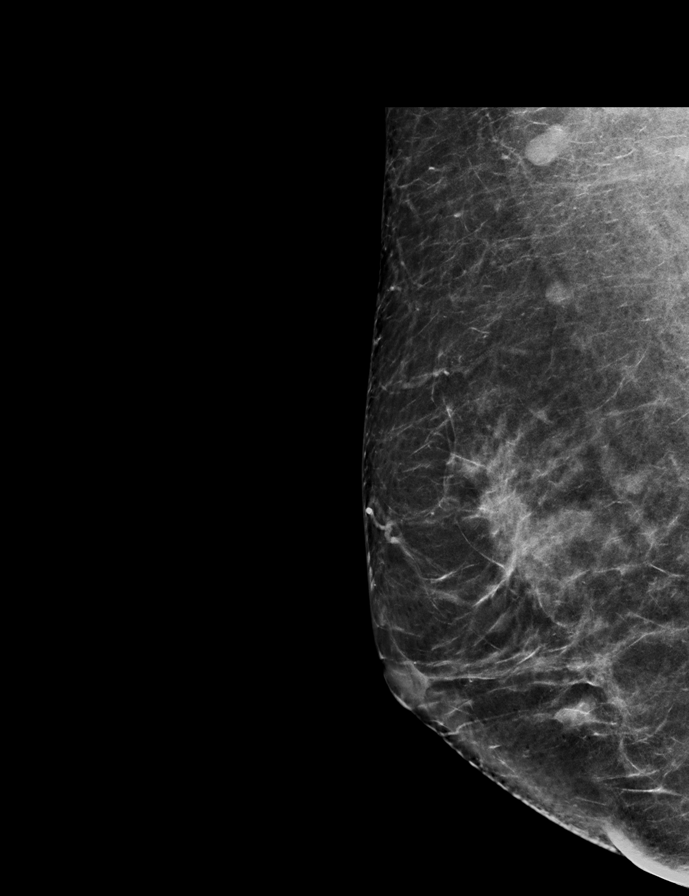

[L CC synth-2D]
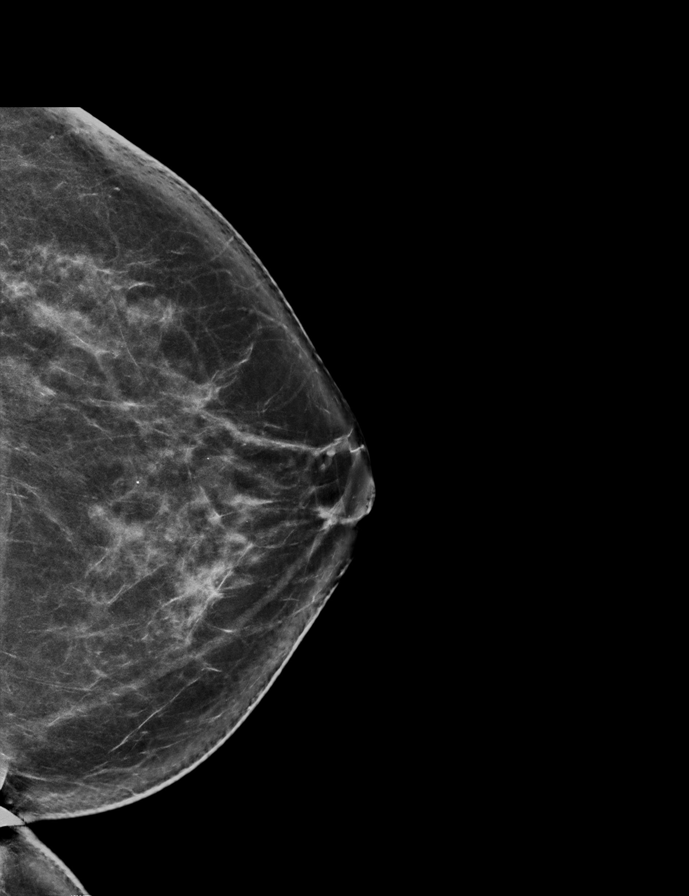

[L MLO synth-2D]
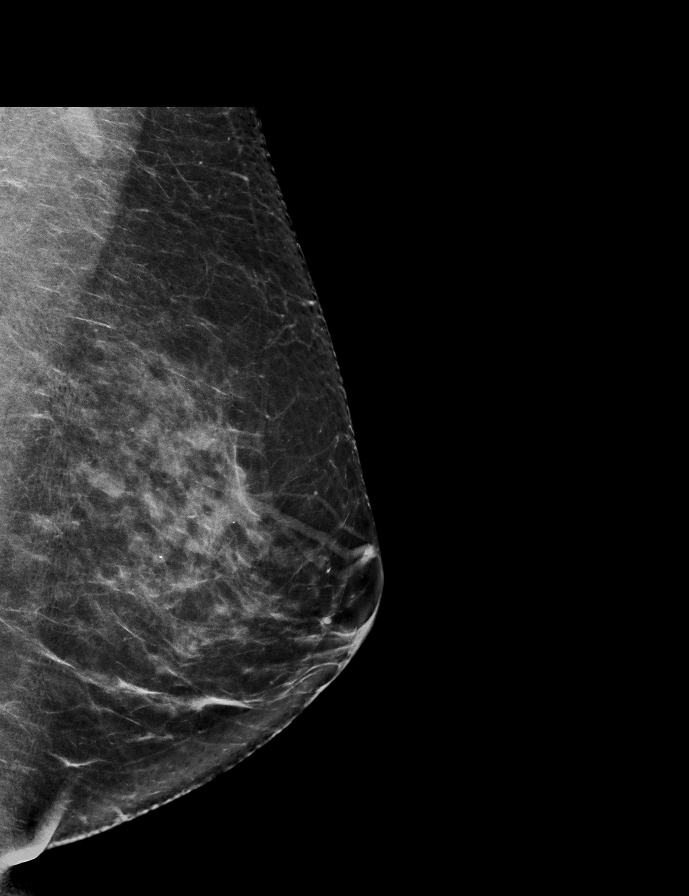

[L CC tomo · 2 of 76 frames shown]
[frame 25/76]
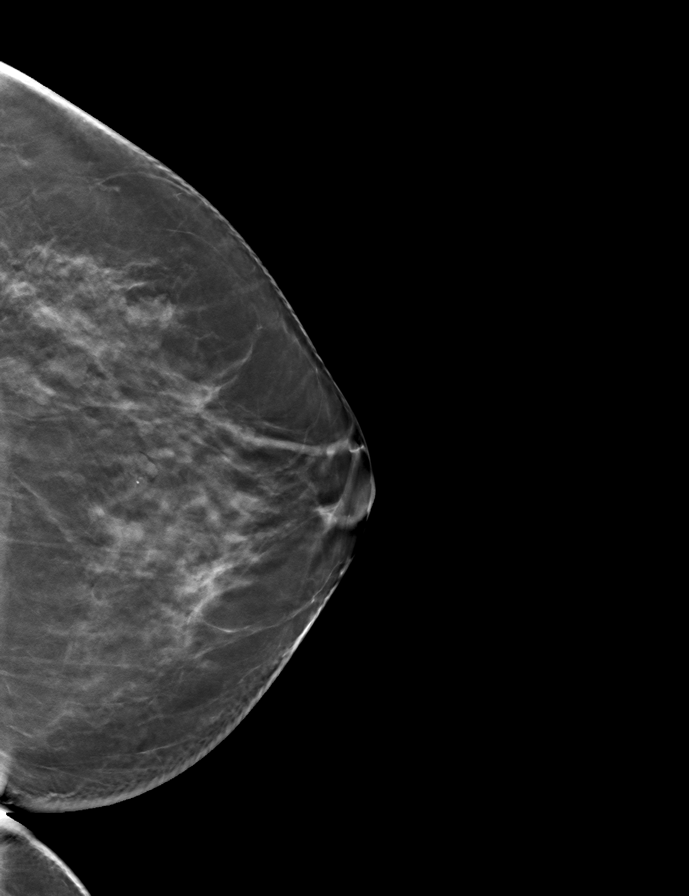
[frame 39/76]
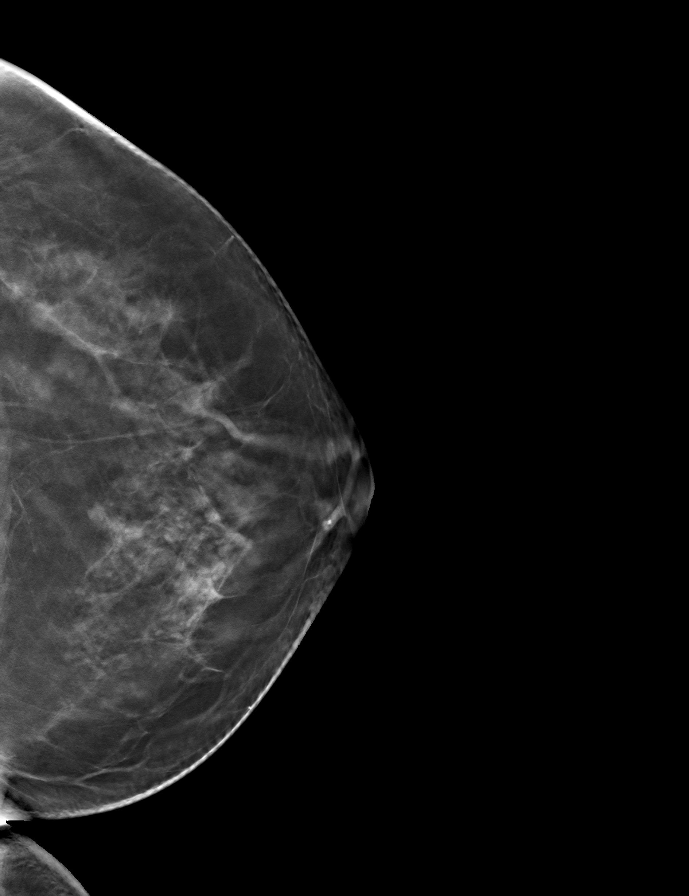

[L MLO tomo · tomo slice 39/77.0]
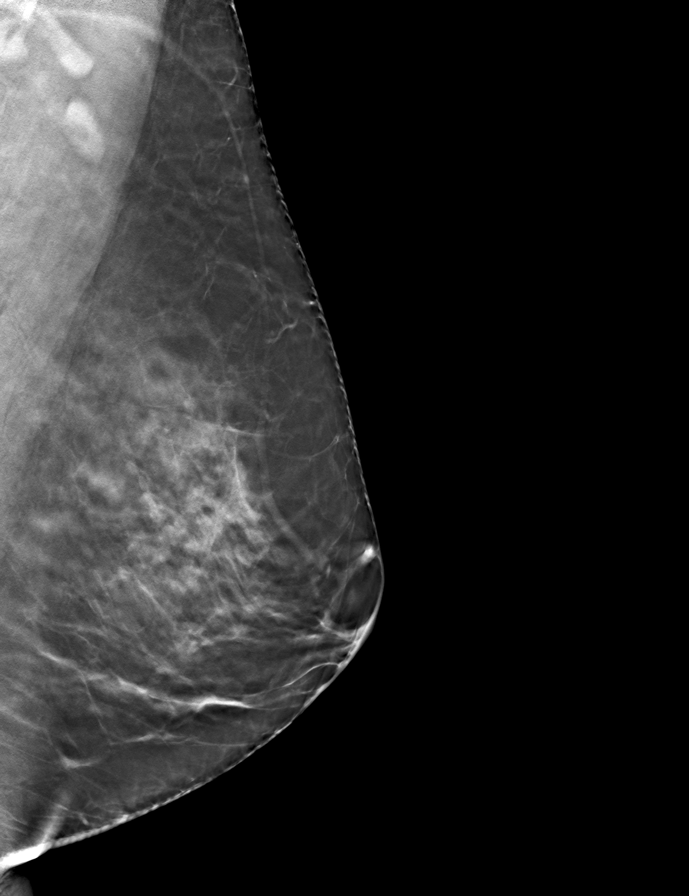

[R CC tomo · tomo slice 39/77.0]
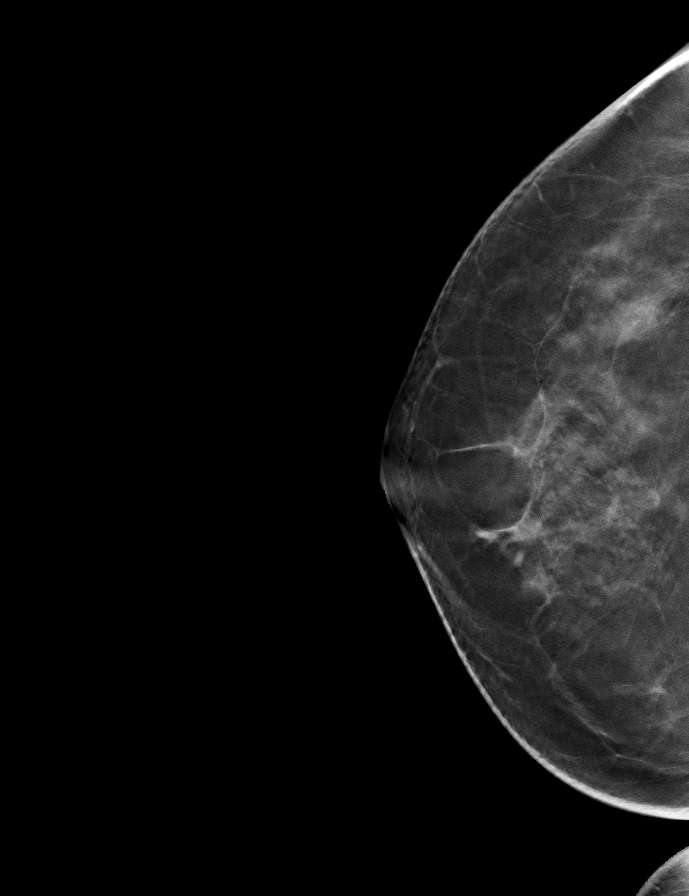

[R MLO tomo · tomo slice 39/76.0]
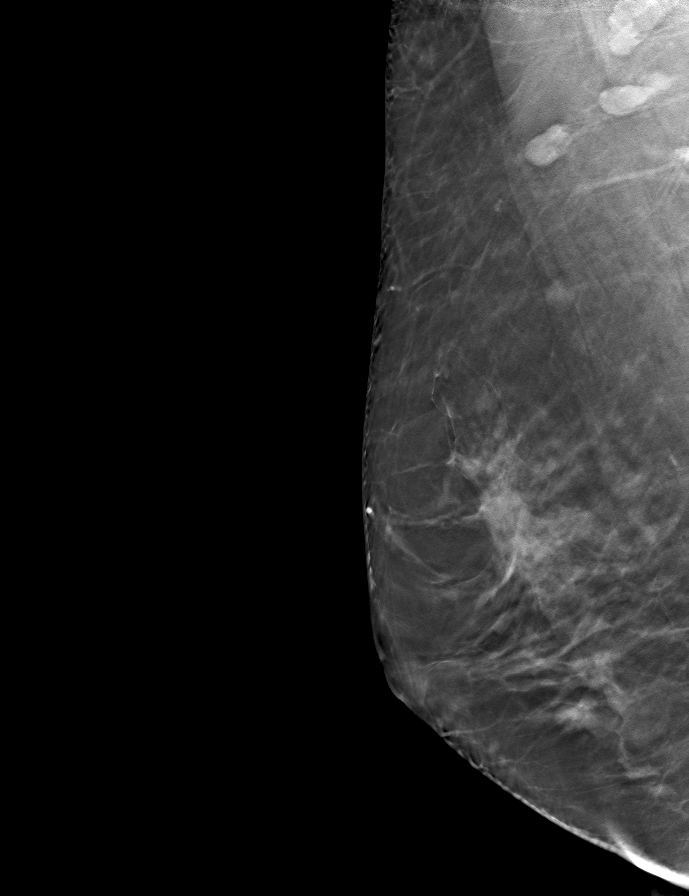

[9 of 24 positions shown; findings below may reference images not displayed]

ACR Breast Density Category c: The breast tissue is heterogeneously
dense, which may obscure small masses.
FINDINGS: In the right breast, a possible mass warrants further evaluation. In
the left breast, no findings suspicious for malignancy. Images were
processed with CAD.
IMPRESSION: Further evaluation is suggested for a possible mass in the right
breast.

RECOMMENDATION:
Diagnostic mammogram and possibly ultrasound of the right breast.
(Code:WG-2-99N)

The patient will be contacted regarding the findings, and additional
imaging will be scheduled.

BI-RADS CATEGORY  0: Incomplete. Need additional imaging evaluation
and/or prior mammograms for comparison.

## 2022-01-21 ENCOUNTER — Other Ambulatory Visit: Payer: Self-pay | Admitting: Adult Health

## 2022-07-11 ENCOUNTER — Other Ambulatory Visit: Payer: Self-pay | Admitting: Adult Health

## 2022-08-31 ENCOUNTER — Ambulatory Visit: Payer: 59 | Admitting: Adult Health

## 2022-09-09 ENCOUNTER — Ambulatory Visit: Payer: 59 | Admitting: Adult Health

## 2022-09-28 ENCOUNTER — Other Ambulatory Visit: Payer: Self-pay | Admitting: Adult Health

## 2022-10-20 ENCOUNTER — Ambulatory Visit (HOSPITAL_COMMUNITY)
Admission: RE | Admit: 2022-10-20 | Discharge: 2022-10-20 | Disposition: A | Discharge status: Home / Self Care | Payer: Managed Care, Other (non HMO) | Source: Ambulatory Visit | Attending: Adult Health | Admitting: Adult Health

## 2022-10-20 ENCOUNTER — Encounter: Payer: Self-pay | Admitting: Adult Health

## 2022-10-20 ENCOUNTER — Ambulatory Visit (INDEPENDENT_AMBULATORY_CARE_PROVIDER_SITE_OTHER): Payer: Managed Care, Other (non HMO) | Admitting: Adult Health

## 2022-10-20 ENCOUNTER — Other Ambulatory Visit (HOSPITAL_COMMUNITY)
Admission: RE | Admit: 2022-10-20 | Discharge: 2022-10-20 | Disposition: A | Discharge status: Home / Self Care | Payer: Managed Care, Other (non HMO) | Source: Ambulatory Visit | Attending: Adult Health | Admitting: Adult Health

## 2022-10-20 VITALS — BP 148/97 | HR 78 | Ht 60.0 in | Wt 140.5 lb

## 2022-10-20 DIAGNOSIS — Z1231 Encounter for screening mammogram for malignant neoplasm of breast: Secondary | ICD-10-CM

## 2022-10-20 DIAGNOSIS — R030 Elevated blood-pressure reading, without diagnosis of hypertension: Secondary | ICD-10-CM | POA: Diagnosis not present

## 2022-10-20 DIAGNOSIS — Z13220 Encounter for screening for lipoid disorders: Secondary | ICD-10-CM

## 2022-10-20 DIAGNOSIS — Z01419 Encounter for gynecological examination (general) (routine) without abnormal findings: Secondary | ICD-10-CM

## 2022-10-20 DIAGNOSIS — F419 Anxiety disorder, unspecified: Secondary | ICD-10-CM

## 2022-10-20 DIAGNOSIS — Z1211 Encounter for screening for malignant neoplasm of colon: Secondary | ICD-10-CM | POA: Diagnosis not present

## 2022-10-20 DIAGNOSIS — Z1212 Encounter for screening for malignant neoplasm of rectum: Secondary | ICD-10-CM

## 2022-10-20 LAB — HEMOCCULT GUIAC POC 1CARD (OFFICE): Fecal Occult Blood, POC: NEGATIVE

## 2022-10-20 MED ORDER — LORAZEPAM 0.5 MG PO TABS: 0.5000 mg | ORAL_TABLET | Freq: Three times a day (TID) | ORAL | 0 refills | Status: AC

## 2022-10-20 NOTE — Progress Notes (Addendum)
Patient ID: [name], female   DOB: [birth year], 52 y.o.   MRN: [id number] History of Present Illness: [name] is a 52 year old white female, married, G2P2002, in for a well woman gyn exam and pap. She is living in [place] and works for [employer].  She is looking of PCP   Current Medications, Allergies, Past Medical History, Past Surgical History, Family History and Social History were reviewed in [employer] record.     Review of Systems: Patient denies any headaches, hearing loss, fatigue, blurred vision, shortness of breath, chest pain, abdominal pain, problems with bowel movements, urination, or intercourse. No joint pain or mood swings.  Had spotting last month on OC +anxiety, can't relax   Physical Exam:BP (!) 148/97 (BP Location: Right Arm, Patient Position: Sitting, Cuff Size: Normal)   Pulse 78   Ht 5' (1.524 m)   Wt 140 lb 8 oz (63.7 kg)   LMP 09/19/2022 (Approximate)   BMI 27.44 kg/m   General:  Well developed, well nourished, no acute distress Skin:  Warm and dry Neck:  Midline trachea, normal thyroid, good ROM, no lymphadenopathy Lungs; Clear to auscultation bilaterally Breast:  No dominant palpable mass, retraction, or nipple discharge Cardiovascular: Regular rate and rhythm Abdomen:  Soft, non tender, no hepatosplenomegaly Pelvic:  External genitalia is normal in appearance, no lesions.  The vagina is normal in appearance. Urethra has no lesions or masses. The cervix is smooth, friable with EC brush, pap with HR HPV genotyping performed.  Uterus is felt to be normal size, shape, and contour.  No adnexal masses or tenderness noted.Bladder is non tender, no masses felt. Rectal: Good sphincter tone, no polyps, or hemorrhoids felt.  Hemoccult negative. Extremities/musculoskeletal:  No swelling or varicosities noted, no clubbing or cyanosis Psych:  No mood changes, alert and cooperative,seems happy AA is 2 Fall risk is low     10/20/2022   10:48 AM 04/08/2021   11:49 AM 12/11/2019    9:58 AM  Depression screen PHQ 2/9  Decreased Interest 2 1 1   Down, Depressed, Hopeless 2 1 1   PHQ - 2 Score 4 2 2   Altered sleeping 3 1 1   Tired, decreased energy 1 1 1   Change in appetite 0 1 1  Feeling bad or failure about yourself  0 1 2  Trouble concentrating 3 1 2   Moving slowly or fidgety/restless 3 0 2  Suicidal thoughts 0 0 0  PHQ-9 Score 14 7 11   Difficult doing work/chores   Somewhat difficult       10/20/2022   10:48 AM 04/08/2021   11:49 AM 12/11/2019   10:00 AM  GAD 7 : Generalized Anxiety Score  Nervous, Anxious, on Edge 3 2 2   Control/stop worrying 3 2 2   Worry too much - different things 3 2 2   Trouble relaxing 3 2 2   Restless 3 2 2   Easily annoyed or irritable 2 2 2   Afraid - awful might happen 2 2 0  Total GAD 7 Score 19 14 12       Upstream - 10/20/22 1101       Pregnancy Intention Screening   Does the patient want to become pregnant in the next year? No    Does the patient's partner want to become pregnant in the next year? No    Would the patient like to discuss contraceptive options today? No      Contraception Wrap Up   Current Method Oral Contraceptive  End Method Female Condom    Contraception Counseling Provided Yes             Examination chaperoned by Malachy Mood LPN  Impression and Plan: 1. Encounter for gynecological examination with Papanicolaou smear of cervix Pap sent Pap in 3 years if normal Physical in 1 year Will check labs - Cytology - PAP( [place]) - CBC - Comprehensive metabolic panel Try to get PCP  Stop BCP, use condoms and check FSH in 1 month, has order   2. Anxiety +anxiety, can't relax Will rx ativan, she says atarax did not help and does not want to take meds every day  Meds ordered this encounter  Medications   LORazepam (ATIVAN) 0.5 MG tablet    Sig: Take 1 tablet (0.5 mg total) by mouth every 8 (eight) hours. Take 1 tablet 0.5 mg by mouth  every 8 hours prn anxiety    Dispense:  30 tablet    Refill:  0    Order Specific Question:   Supervising Provider    Answer:   Despina Hidden, LUTHER H [[id number]]    3. Encounter for screening fecal occult blood testing Hemoccult was negative  - POCT occult blood stool  4. Elevated BP without diagnosis of hypertension She declines meds Keep check on BP at home  - Comprehensive metabolic panel  5. Screening cholesterol level - Lipid panel  6. Screening mammogram for breast cancer Mammogram scheduled for her today at 12:45 pm at [facility] - MM 3D SCREENING MAMMOGRAM BILATERAL BREAST; Future  7. Screening for colorectal cancer Will order cologuard  - Cologuard

## 2022-10-21 LAB — CBC
Hematocrit: 46.1 % (ref 34.0–46.6)
Hemoglobin: 14.7 g/dL (ref 11.1–15.9)
MCH: 30.2 pg (ref 26.6–33.0)
MCHC: 31.9 g/dL (ref 31.5–35.7)
MCV: 95 fL (ref 79–97)
Platelets: 323 10*3/uL (ref 150–450)
RBC: 4.86 x10E6/uL (ref 3.77–5.28)
RDW: 12.3 % (ref 11.7–15.4)
WBC: 6.9 10*3/uL (ref 3.4–10.8)

## 2022-10-21 LAB — LIPID PANEL
Chol/HDL Ratio: 4.7 ratio — ABNORMAL HIGH (ref 0.0–4.4)
Cholesterol, Total: 217 mg/dL — ABNORMAL HIGH (ref 100–199)
HDL: 46 mg/dL (ref >39)
LDL Chol Calc (NIH): 140 mg/dL — ABNORMAL HIGH (ref 0–99)
Triglycerides: 173 mg/dL — ABNORMAL HIGH (ref 0–149)
VLDL Cholesterol Cal: 31 mg/dL (ref 5–40)

## 2022-10-21 LAB — COMPREHENSIVE METABOLIC PANEL
ALT: 14 IU/L (ref 0–32)
AST: 18 IU/L (ref 0–40)
Albumin: 4.3 g/dL (ref 3.8–4.9)
Alkaline Phosphatase: 49 IU/L (ref 44–121)
BUN/Creatinine Ratio: 16 (ref 9–23)
BUN: 12 mg/dL (ref 6–24)
Bilirubin Total: 0.2 mg/dL (ref 0.0–1.2)
CO2: 22 mmol/L (ref 20–29)
Calcium: 9 mg/dL (ref 8.7–10.2)
Chloride: 103 mmol/L (ref 96–106)
Creatinine, Ser: 0.73 mg/dL (ref 0.57–1.00)
Globulin, Total: 2.5 g/dL (ref 1.5–4.5)
Glucose: 74 mg/dL (ref 70–99)
Potassium: 4 mmol/L (ref 3.5–5.2)
Sodium: 140 mmol/L (ref 134–144)
Total Protein: 6.8 g/dL (ref 6.0–8.5)
eGFR: 98 mL/min/{1.73_m2} (ref >59)

## 2022-10-22 LAB — CYTOLOGY - PAP
Comment: NEGATIVE
Diagnosis: NEGATIVE
High risk HPV: NEGATIVE

## 2022-11-15 ENCOUNTER — Encounter: Payer: Self-pay | Admitting: Adult Health

## 2022-11-15 DIAGNOSIS — R195 Other fecal abnormalities: Secondary | ICD-10-CM | POA: Insufficient documentation

## 2022-11-15 LAB — COLOGUARD: COLOGUARD: POSITIVE — AB

## 2022-11-18 ENCOUNTER — Telehealth: Payer: Self-pay | Admitting: *Deleted

## 2022-11-18 NOTE — Telephone Encounter (Signed)
Pt aware cologuard was positive and needs a colonoscopy. Pt wants to see what providers are close to where she lives and will let us know once she finds one for referral. JSY

## 2022-12-30 ENCOUNTER — Other Ambulatory Visit: Payer: Self-pay | Admitting: Adult Health

## 2022-12-31 LAB — FOLLICLE STIMULATING HORMONE: FSH: 85.7 m[IU]/mL

## 2023-03-08 NOTE — Telephone Encounter (Signed)
I called pt to follow up on the need for a colonoscopy. Pt states she is still trying to find a PCP that is taking new pt's. Will discuss need for colonoscopy with PCP once she finds one. JSY

## 2023-06-07 NOTE — Telephone Encounter (Signed)
 Left message with pt @ 11:45 am to see if she has found a PCP yet. JSY

## 2023-09-29 NOTE — Telephone Encounter (Signed)
 Left message @ 11:41 am, asking pt if she had found a PCP. Advised to call back with this info or send a Mychart message. JSY

## 2024-01-17 NOTE — Telephone Encounter (Signed)
 Left message @ 2:46 pm, asking pt if she had found a PCP. Advised can call back or send a mychart message. JSY
